# Patient Record
Sex: Female | Born: 2000 | Race: White | Hispanic: No
Health system: Southern US, Community
[De-identification: ages and names within clinical notes are randomized; demographics above are authoritative.]

## PROBLEM LIST (undated history)

## (undated) DIAGNOSIS — R011 Cardiac murmur, unspecified: Secondary | ICD-10-CM

## (undated) DIAGNOSIS — W57XXXA Bitten or stung by nonvenomous insect and other nonvenomous arthropods, initial encounter: Secondary | ICD-10-CM

## (undated) DIAGNOSIS — R17 Unspecified jaundice: Secondary | ICD-10-CM

## (undated) DIAGNOSIS — S1096XA Insect bite of unspecified part of neck, initial encounter: Secondary | ICD-10-CM

## (undated) DIAGNOSIS — R4689 Other symptoms and signs involving appearance and behavior: Secondary | ICD-10-CM

## (undated) DIAGNOSIS — J189 Pneumonia, unspecified organism: Secondary | ICD-10-CM

## (undated) HISTORY — DX: Pneumonia, unspecified organism: J18.9

## (undated) HISTORY — DX: Cardiac murmur, unspecified: R01.1

## (undated) HISTORY — DX: Unspecified jaundice: R17

## (undated) HISTORY — DX: Other symptoms and signs involving appearance and behavior: R46.89

## (undated) HISTORY — DX: Bitten or stung by nonvenomous insect and other nonvenomous arthropods, initial encounter: W57.XXXA

## (undated) HISTORY — DX: Insect bite of unspecified part of neck, initial encounter: S10.96XA

---
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
## 2022-02-01 ENCOUNTER — Ambulatory Visit
Admission: EM | Admit: 2022-02-01 | Discharge: 2022-02-01 | Disposition: A | Discharge status: Home / Self Care | Payer: BC Managed Care – PPO | Attending: Nurse Practitioner | Admitting: Nurse Practitioner

## 2022-02-01 DIAGNOSIS — G43801 Other migraine, not intractable, with status migrainosus: Secondary | ICD-10-CM | POA: Diagnosis not present

## 2022-02-01 MED ORDER — KETOROLAC TROMETHAMINE 30 MG/ML IJ SOLN
30.0000 mg | Freq: Once | INTRAMUSCULAR | Status: AC
  Administered 2022-02-01: 30 mg via INTRAMUSCULAR

## 2022-02-01 MED ORDER — IBUPROFEN 600 MG PO TABS: 600.0000 mg | ORAL_TABLET | Freq: Four times a day (QID) | ORAL | 0 refills | Status: AC | PRN

## 2022-02-01 NOTE — Discharge Instructions (Addendum)
You have been given an injection of Toradol 30 mg for your headache pain today. Take medication as prescribed.  Take medication with food and water as needed for headache pain. Continue Zofran as needed for nausea or vomiting. Please follow-up with your neurologist on Monday if symptoms continue to persist. Follow-up in the emergency department immediately if you of sudden change or loss of vision, dizziness, lightheadedness, weakness, numbness, or tingling. Follow-up as needed.
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
## 2022-12-16 ENCOUNTER — Encounter: Payer: Self-pay | Admitting: Pediatrics
